# Patient Record
Sex: Female | Born: 1960 | Hispanic: No | Marital: Married
Health system: Southern US, Community
[De-identification: ages and names within clinical notes are randomized; demographics above are authoritative.]

## PROBLEM LIST (undated history)

## (undated) DIAGNOSIS — E079 Disorder of thyroid, unspecified: Secondary | ICD-10-CM

---

## 2010-03-06 ENCOUNTER — Ambulatory Visit (HOSPITAL_COMMUNITY)
Admission: RE | Admit: 2010-03-06 | Discharge: 2010-03-06 | Payer: Self-pay | Source: Home / Self Care | Admitting: Obstetrics and Gynecology

## 2010-07-03 LAB — CBC
Hemoglobin: 11.2 g/dL — ABNORMAL LOW (ref 12.0–15.0)
MCHC: 32.3 g/dL (ref 30.0–36.0)
Platelets: 409 10*3/uL — ABNORMAL HIGH (ref 150–400)

## 2010-07-03 LAB — BASIC METABOLIC PANEL
Calcium: 8.7 mg/dL (ref 8.4–10.5)
Creatinine, Ser: 0.99 mg/dL (ref 0.4–1.2)
GFR calc Af Amer: >60 mL/min (ref >60)
GFR calc non Af Amer: >60 mL/min (ref >60)
Glucose, Bld: 89 mg/dL (ref 70–99)
Sodium: 135 mEq/L (ref 135–145)

## 2010-07-03 LAB — HCG, SERUM, QUALITATIVE: Preg, Serum: NEGATIVE

## 2014-12-15 ENCOUNTER — Encounter: Payer: Self-pay | Admitting: Endocrinology

## 2017-08-19 ENCOUNTER — Emergency Department (HOSPITAL_COMMUNITY): Payer: BLUE CROSS/BLUE SHIELD

## 2017-08-19 ENCOUNTER — Encounter (HOSPITAL_COMMUNITY): Payer: Self-pay

## 2017-08-19 ENCOUNTER — Emergency Department (HOSPITAL_COMMUNITY)
Admission: EM | Admit: 2017-08-19 | Discharge: 2017-08-19 | Disposition: A | Discharge status: Home / Self Care | Payer: BLUE CROSS/BLUE SHIELD | Attending: Emergency Medicine | Admitting: Emergency Medicine

## 2017-08-19 ENCOUNTER — Other Ambulatory Visit: Payer: Self-pay

## 2017-08-19 DIAGNOSIS — R0602 Shortness of breath: Secondary | ICD-10-CM | POA: Diagnosis not present

## 2017-08-19 DIAGNOSIS — R110 Nausea: Secondary | ICD-10-CM | POA: Diagnosis not present

## 2017-08-19 DIAGNOSIS — R002 Palpitations: Secondary | ICD-10-CM | POA: Diagnosis not present

## 2017-08-19 DIAGNOSIS — R42 Dizziness and giddiness: Secondary | ICD-10-CM | POA: Diagnosis not present

## 2017-08-19 DIAGNOSIS — E876 Hypokalemia: Secondary | ICD-10-CM

## 2017-08-19 DIAGNOSIS — R5383 Other fatigue: Secondary | ICD-10-CM | POA: Diagnosis not present

## 2017-08-19 HISTORY — DX: Disorder of thyroid, unspecified: E07.9

## 2017-08-19 LAB — I-STAT TROPONIN, ED
TROPONIN I, POC: 0.01 ng/mL (ref 0.00–0.08)
Troponin i, poc: 0 ng/mL (ref 0.00–0.08)

## 2017-08-19 LAB — BASIC METABOLIC PANEL
ANION GAP: 15 (ref 5–15)
BUN: 12 mg/dL (ref 6–20)
CHLORIDE: 93 mmol/L — AB (ref 101–111)
CO2: 27 mmol/L (ref 22–32)
Calcium: 9.8 mg/dL (ref 8.9–10.3)
Creatinine, Ser: 1.05 mg/dL — ABNORMAL HIGH (ref 0.44–1.00)
GFR calc non Af Amer: 59 mL/min — ABNORMAL LOW (ref >60)
GLUCOSE: 128 mg/dL — AB (ref 65–99)
Potassium: 3.1 mmol/L — ABNORMAL LOW (ref 3.5–5.1)
Sodium: 135 mmol/L (ref 135–145)

## 2017-08-19 LAB — CBC
HEMATOCRIT: 48.2 % — AB (ref 36.0–46.0)
Hemoglobin: 16.6 g/dL — ABNORMAL HIGH (ref 12.0–15.0)
MCH: 31.5 pg (ref 26.0–34.0)
MCHC: 34.4 g/dL (ref 30.0–36.0)
MCV: 91.5 fL (ref 78.0–100.0)
Platelets: 353 10*3/uL (ref 150–400)
RBC: 5.27 MIL/uL — AB (ref 3.87–5.11)
RDW: 13 % (ref 11.5–15.5)
WBC: 9.5 10*3/uL (ref 4.0–10.5)

## 2017-08-19 LAB — T4, FREE: Free T4: 1.87 ng/dL — ABNORMAL HIGH (ref 0.82–1.77)

## 2017-08-19 LAB — D-DIMER, QUANTITATIVE (NOT AT ARMC)

## 2017-08-19 LAB — TSH: TSH: 0.785 u[IU]/mL (ref 0.350–4.500)

## 2017-08-19 MED ORDER — ONDANSETRON HCL 4 MG/2ML IJ SOLN
4.0000 mg | Freq: Once | INTRAMUSCULAR | Status: AC
  Administered 2017-08-19: 4 mg via INTRAVENOUS
  Filled 2017-08-19: qty 2

## 2017-08-19 MED ORDER — ONDANSETRON 4 MG PO TBDP: 4.0000 mg | ORAL_TABLET | Freq: Three times a day (TID) | ORAL | 0 refills | Status: AC | PRN

## 2017-08-19 MED ORDER — SODIUM CHLORIDE 0.9 % IV BOLUS
1000.0000 mL | Freq: Once | INTRAVENOUS | Status: AC
  Administered 2017-08-19: 1000 mL via INTRAVENOUS

## 2017-08-19 MED ORDER — POTASSIUM CHLORIDE CRYS ER 20 MEQ PO TBCR
60.0000 meq | EXTENDED_RELEASE_TABLET | Freq: Once | ORAL | Status: AC
  Administered 2017-08-19: 60 meq via ORAL
  Filled 2017-08-19: qty 3

## 2017-08-19 NOTE — ED Provider Notes (Signed)
Patient placed in Quick Look pathway, seen and evaluated   Chief Complaint: "not feeling well"  HPI:   Pt is a 57 y.o. female with a PMHx of hypothyroidism and HTN, presenting today with c/o not feeling well recently, having palpitations, occasional lightheadedness, fatigue, occasional nausea, and shortness of breath that she describes as feeling like she "cannot get a good breath", like it's "unsatisfying", but denies any pain with breathing.  Denies any chest pain.  She was seen by her PCP today and had an abnormal EKG so they sent her here for "enzymes" to make sure she wasn't having a heart attack.  ROS: +SOB, +palpitations, +lightheadedness, +nausea, +fatigue. No CP.    Physical Exam:  BP (!) 131/99 (BP Location: Right Arm)   Pulse (!) 108   Temp 98.1 F (36.7 C) (Oral)   Resp 18   SpO2 98%    Gen: No distress  Neuro: Awake and Alert  Skin: Warm    Focused Exam: Cardio: mildly tachycardic in the low 100s, reg rhythm, nl s1/s2, no m/r/g, distal pulses intact, no pedal edema. Pulm: CTAB in all lung fields, no w/r/r, no hypoxia or increased WOB, speaking in full sentences, SpO2 98% on RA    Initiation of care has begun. The patient has been counseled on the process, plan, and necessity for staying for the completion/evaluation, and the remainder of the medical screening examination     [address], [place], [place] 08/19/17 1430    Melene Plan, DO 08/19/17 1649

## 2017-08-19 NOTE — ED Triage Notes (Signed)
Pt presents for evaluation of sob and abnormal ekg. Pt went to pcp due to not feeling well (URI symptoms) x past week. Pt states its difficulty to get a good breath.

## 2017-08-19 NOTE — Discharge Instructions (Addendum)
Your labs, xray, and EKG are all reassuring, except for a low potassium level but you were given potassium here today; see the list of foods below that have potassium in them to increase the intake in your diet of this. Your symptoms could be from a variety of things including dehydration, muscle pain, indigestion, and other nonemergent issues. Use over the counter zantac/tums/maalox/pepto bismol as needed for symptoms. Avoid spicy/fatty/fried/acidic foods, avoid coffee/tea/soda/alcohol. Use zofran as directed as needed for nausea. Stay well hydrated and get plenty of rest. Use tylenol as needed for pain, use ibuprofen/NSAIDs sparingly and never on an empty stomach. You may consider using heat to the areas of chest pain, no more than 20 minutes every hour. Avoid caffeine as this can make palpitations and blood pressure worse. Eat a low salt/low sodium diet to help with blood pressure control. Keep a log of blood pressure readings morning and night to take to your next doctor's visit so they can continue to manage this issue. Follow up with your regular doctor in 3-5 days for recheck of symptoms. Return to the ER for changes or worsening symptoms.  SEEK IMMEDIATE MEDICAL ATTENTION IF: You develop a fever.  Your chest pains become severe or intolerable.  You develop new, unexplained symptoms (problems).  You develop worsening shortness of breath, nausea, vomiting, sweating or feel light headed.  You develop a new cough or you cough up blood. You develop new leg swelling

## 2017-08-19 NOTE — ED Provider Notes (Signed)
[facility] EMERGENCY DEPARTMENT Provider Note   CSN: [id number] Arrival date & time: 08/19/17  1344     History   Chief Complaint Chief Complaint  Patient presents with  . Shortness of Breath  . Abnormal ECG    HPI Caitlyn Avila is a 57 y.o. female with a PMHx of HTN, GERD, hiatal hernia, and hypothyroidism, who presents to the ED with complaints of "not feeling well" for about 5 days.  Patient states that she has had intermittent nausea, palpitations, feeling jittery, intermittent lightheadedness with standing, fatigue, and shortness of breath that she describes as feeling like she "cannot get a good breath" and as though it is "unsatisfying" when she breathes but denies painful breathing.  She also states that she gets "twinges" of occasional intermittent nonradiating left-sided chest pain that feels like a sharp tightness lasting for a few seconds before self resolving, with no known aggravating or alleviating factors; states she does not have this currently, but it happens occasionally, at random.  She has not tried anything for symptoms, no known aggravating factors.  She states that she used to be on triamterene-HCTZ 37.5-25 mg for her blood pressure but her blood pressure was doing well so she came off of it about 6 months ago and had done well until Friday when she noticed her blood pressure was slightly elevated so she restarted it thinking that this could have been why she was having these symptoms.  Otherwise she has not started any new medications recently.  Chart review reveals that she was seen by her PCP today and her EKG showed Q waves in V leads, so she was sent here for cardiac enzymes and further work up.  She is a non-smoker with no known family history of cardiac disease.  She mentions that her maternal grandfather and her maternal uncle both had PEs but otherwise no other family members with this, and no personal history of DVT/PE.  She denies any fevers,  chills, URI symptoms, cough, ongoing chest pain, diaphoresis, LE swelling, recent travel/surgery/immobilization, estrogen use, abd pain, V/D/C, hematuria, dysuria, myalgias, arthralgias, claudication, orthopnea, numbness, tingling, focal weakness, or any other complaints at this time.   The history is provided by the patient and medical records. No language interpreter was used.  Shortness of Breath  Associated symptoms include chest pain (occasionally, none ongoing). Pertinent negatives include no fever, no rhinorrhea, no sore throat, no ear pain, no cough, no vomiting, no abdominal pain and no leg swelling.    Past Medical History:  Diagnosis Date  . Thyroid disease     There are no active problems to display for this patient.   History reviewed. No pertinent surgical history.   OB History   None      Home Medications    Prior to Admission medications   Not on File    Family History No family history on file.  Social History Social History   Tobacco Use  . Smoking status: Not on file  Substance Use Topics  . Alcohol use: Not on file  . Drug use: Not on file     Allergies   Patient has no known allergies.   Review of Systems Review of Systems  Constitutional: Positive for fatigue. Negative for chills, diaphoresis and fever.       +jittery feeling  HENT: Negative for ear discharge, ear pain, rhinorrhea and sore throat.   Respiratory: Positive for shortness of breath. Negative for cough.   Cardiovascular: Positive for  chest pain (occasionally, none ongoing) and palpitations. Negative for leg swelling.  Gastrointestinal: Positive for nausea (intermittent). Negative for abdominal pain, constipation, diarrhea and vomiting.  Genitourinary: Negative for dysuria and hematuria.  Musculoskeletal: Negative for arthralgias and myalgias.  Skin: Negative for color change.  Allergic/Immunologic: Negative for immunocompromised state.  Neurological: Positive for  light-headedness. Negative for weakness and numbness.  Psychiatric/Behavioral: Negative for confusion.   All other systems reviewed and are negative for acute change except as noted in the HPI.    Physical Exam Updated Vital Signs BP 138/90 (BP Location: Right Arm)   Pulse 83   Temp 98.1 F (36.7 C) (Oral)   Resp 16   SpO2 97%   Physical Exam  Constitutional: She is oriented to person, place, and time. Vital signs are normal. She appears well-developed and well-nourished.  Non-toxic appearance. No distress.  Afebrile, nontoxic, NAD  HENT:  Head: Normocephalic and atraumatic.  Mouth/Throat: Oropharynx is clear and moist. Mucous membranes are dry.  Lips slightly dry  Eyes: Conjunctivae and EOM are normal. Right eye exhibits no discharge. Left eye exhibits no discharge.  Neck: Normal range of motion. Neck supple.  Cardiovascular: Normal rate, regular rhythm, normal heart sounds and intact distal pulses. Exam reveals no gallop and no friction rub.  No murmur heard. Earlier she was tachycardic however on exam she has a RRR, nl s1/s2, no m/r/g, distal pulses intact, no pedal edema   Pulmonary/Chest: Effort normal and breath sounds normal. No respiratory distress. She has no decreased breath sounds. She has no wheezes. She has no rhonchi. She has no rales. She exhibits no tenderness, no crepitus, no deformity and no retraction.  CTAB in all lung fields, no w/r/r, no hypoxia or increased WOB, speaking in full sentences, SpO2 97% on RA Chest wall nonTTP without crepitus, deformities, or retractions   Abdominal: Soft. Normal appearance and bowel sounds are normal. She exhibits no distension. There is tenderness in the epigastric area. There is no rigidity, no rebound, no guarding, no CVA tenderness, no tenderness at McBurney's point and negative Murphy's sign.  Soft, obese but nondistended, +BS throughout, with mild epigastric TTP, no r/g/r, neg murphy's, neg mcburney's, no CVA TTP     Musculoskeletal: Normal range of motion.  MAE x4 Strength and sensation grossly intact in all extremities Distal pulses intact Gait steady No pedal edema, neg homan's bilaterally   Neurological: She is alert and oriented to person, place, and time. She has normal strength. No sensory deficit.  Skin: Skin is warm, dry and intact. No rash noted.  Psychiatric: She has a normal mood and affect.  Nursing note and vitals reviewed.    ED Treatments / Results  Labs (all labs ordered are listed, but only abnormal results are displayed) Labs Reviewed  BASIC METABOLIC PANEL - Abnormal; Notable for the following components:      Result Value   Potassium 3.1 (*)    Chloride 93 (*)    Glucose, Bld 128 (*)    Creatinine, Ser 1.05 (*)    GFR calc non Af Amer 59 (*)    All other components within normal limits  CBC - Abnormal; Notable for the following components:   RBC 5.27 (*)    Hemoglobin 16.6 (*)    HCT 48.2 (*)    All other components within normal limits  T4, FREE - Abnormal; Notable for the following components:   Free T4 1.87 (*)    All other components within normal limits  TSH  D-DIMER, QUANTITATIVE (NOT AT [facility])  I-STAT TROPONIN, ED  I-STAT TROPONIN, ED    EKG EKG Interpretation  Date/Time:  Tuesday August 19 2017 14:03:43 EDT Ventricular Rate:  107 PR Interval:  150 QRS Duration: 84 QT Interval:  330 QTC Calculation: 440 R Axis:   -51 Text Interpretation:  Sinus tachycardia Possible Left atrial enlargement Left anterior fascicular block Inferior infarct , age undetermined Anterior infarct , age undetermined Abnormal ECG No old tracing to compare Confirmed by Melene Plan [phone]) on 08/19/2017 5:45:21 PM   Radiology Dg Chest 2 View  Result Date: 08/19/2017 CLINICAL DATA:  Shortness of breath. EXAM: CHEST - 2 VIEW COMPARISON:  None. FINDINGS: The heart size and mediastinal contours are within normal limits. Both lungs are clear. No pneumothorax or pleural effusion is  noted. The visualized skeletal structures are unremarkable. IMPRESSION: No active cardiopulmonary disease. Electronically Signed   By: Lupita Raider, M.D.   On: 08/19/2017 14:45    Procedures Procedures (including critical care time)  Medications Ordered in ED Medications  potassium chloride SA (K-DUR,KLOR-CON) CR tablet 60 mEq (60 mEq Oral Given 08/19/17 1957)  sodium chloride 0.9 % bolus 1,000 mL (0 mLs Intravenous Stopped 08/19/17 1948)  ondansetron (ZOFRAN) injection 4 mg (4 mg Intravenous Given 08/19/17 1901)     Initial Impression / Assessment and Plan / ED Course  I have reviewed the triage vital signs and the nursing notes.  Pertinent labs & imaging results that were available during my care of the patient were reviewed by me and considered in my medical decision making (see chart for details).     57 y.o. female here with not feeling well x5 days, having intermittent palpitations, SOB, jitteriness, fatigue, lightheadedness, nausea, and SOB. Also states she gets occasionally "twinges" of chest pain. On exam, no reproducible chest wall tenderness, she was initially tachycardic in triage, but this has since resolved; no hypoxia or pedal edema; very mild epigastric TTP but nonperitoneal. Work up thus far reveals: trop neg; BMP with mildly low K 3.1 will orally replete, also with mildly elevated Cr 1.05; CBC with elevated Hgb/Hct which also lends itself to hemoconcentration/dehydration; CXR neg; EKG with some anterior and lateral Q waves but otherwise no acute ischemic findings and fairly unremarkable (sinus tachy at the time EKG was done). Given her occasional CP with SOB, and her tachycardia earlier, will get D-dimer; will also get repeat troponin since it's been 4hrs since the first; and get TSH/T4. Will give potassium and fluids and reassess shortly.   6:42 PM Nursing staff telling me that pt vomited; pt states she got very lightheaded and then vomited which happened years ago when she  had BP problems (BP currently 130s/90s). Part of this could be due to the fact that she's been fasting all day because of her earlier PCP appt; will give zofran and let her eat something to try to get some nutrition in while we wait on remainder of work up.   9:39 PM There were some delays in her labs due to hemolysis of the D-dimer, however labs have finally resulted and show: TSH WNL; T4 marginally elevated at 1.87 but this is just barely over the cut off for upper end of normal, doubt clinically relevant at this point given TSH WNL; Second trop 0.00 which is less than her first (which was 0.01); D-dimer negative, therefore doubt PE in this low risk patient. Symptoms could be multifactorial, including dehydration related, medication related (since she just restarted her BP  med), vs indigestion/GI related, etc. Pt feeling much better and tolerating PO well here. Will send home with zofran, advised avoidance of caffeine, staying hydrated, getting rest, and other OTC remedies and diet/lifestyle modifications for symptomatic relief. F/up with PCP closely in 3-5 days for recheck of symptoms. Strict return precautions advised. Discussed case with my attending Dr. Adela Lank who agrees with plan.  I explained the diagnosis and have given explicit precautions to return to the ER including for any other new or worsening symptoms. The patient understands and accepts the medical plan as it's been dictated and I have answered their questions. Discharge instructions concerning home care and prescriptions have been given. The patient is STABLE and is discharged to home in good condition.    Final Clinical Impressions(s) / ED Diagnoses   Final diagnoses:  SOB (shortness of breath)  Intermittent lightheadedness  Nausea  Palpitations  Fatigue, unspecified type  Hypokalemia    ED Discharge Orders        Ordered    ondansetron (ZOFRAN ODT) 4 MG disintegrating tablet  Every 8 hours PRN     08/19/17 [address], [place], [place] 08/19/17 2141    Melene Plan, DO 08/19/17 2144

## 2017-08-19 NOTE — ED Notes (Signed)
Dimer hemolyzed per lab,

## 2018-06-08 DIAGNOSIS — D509 Iron deficiency anemia, unspecified: Secondary | ICD-10-CM | POA: Insufficient documentation

## 2018-06-09 DIAGNOSIS — G43109 Migraine with aura, not intractable, without status migrainosus: Secondary | ICD-10-CM | POA: Insufficient documentation

## 2018-08-17 DIAGNOSIS — Z5320 Procedure and treatment not carried out because of patient's decision for unspecified reasons: Secondary | ICD-10-CM | POA: Insufficient documentation

## 2019-04-03 IMAGING — CR DG CHEST 2V
2 series · 2 of 2 positions shown · non-contrast
Comparison: None.

CLINICAL DATA: Shortness of breath.

EXAM:
CHEST - 2 VIEW

[chest ap]
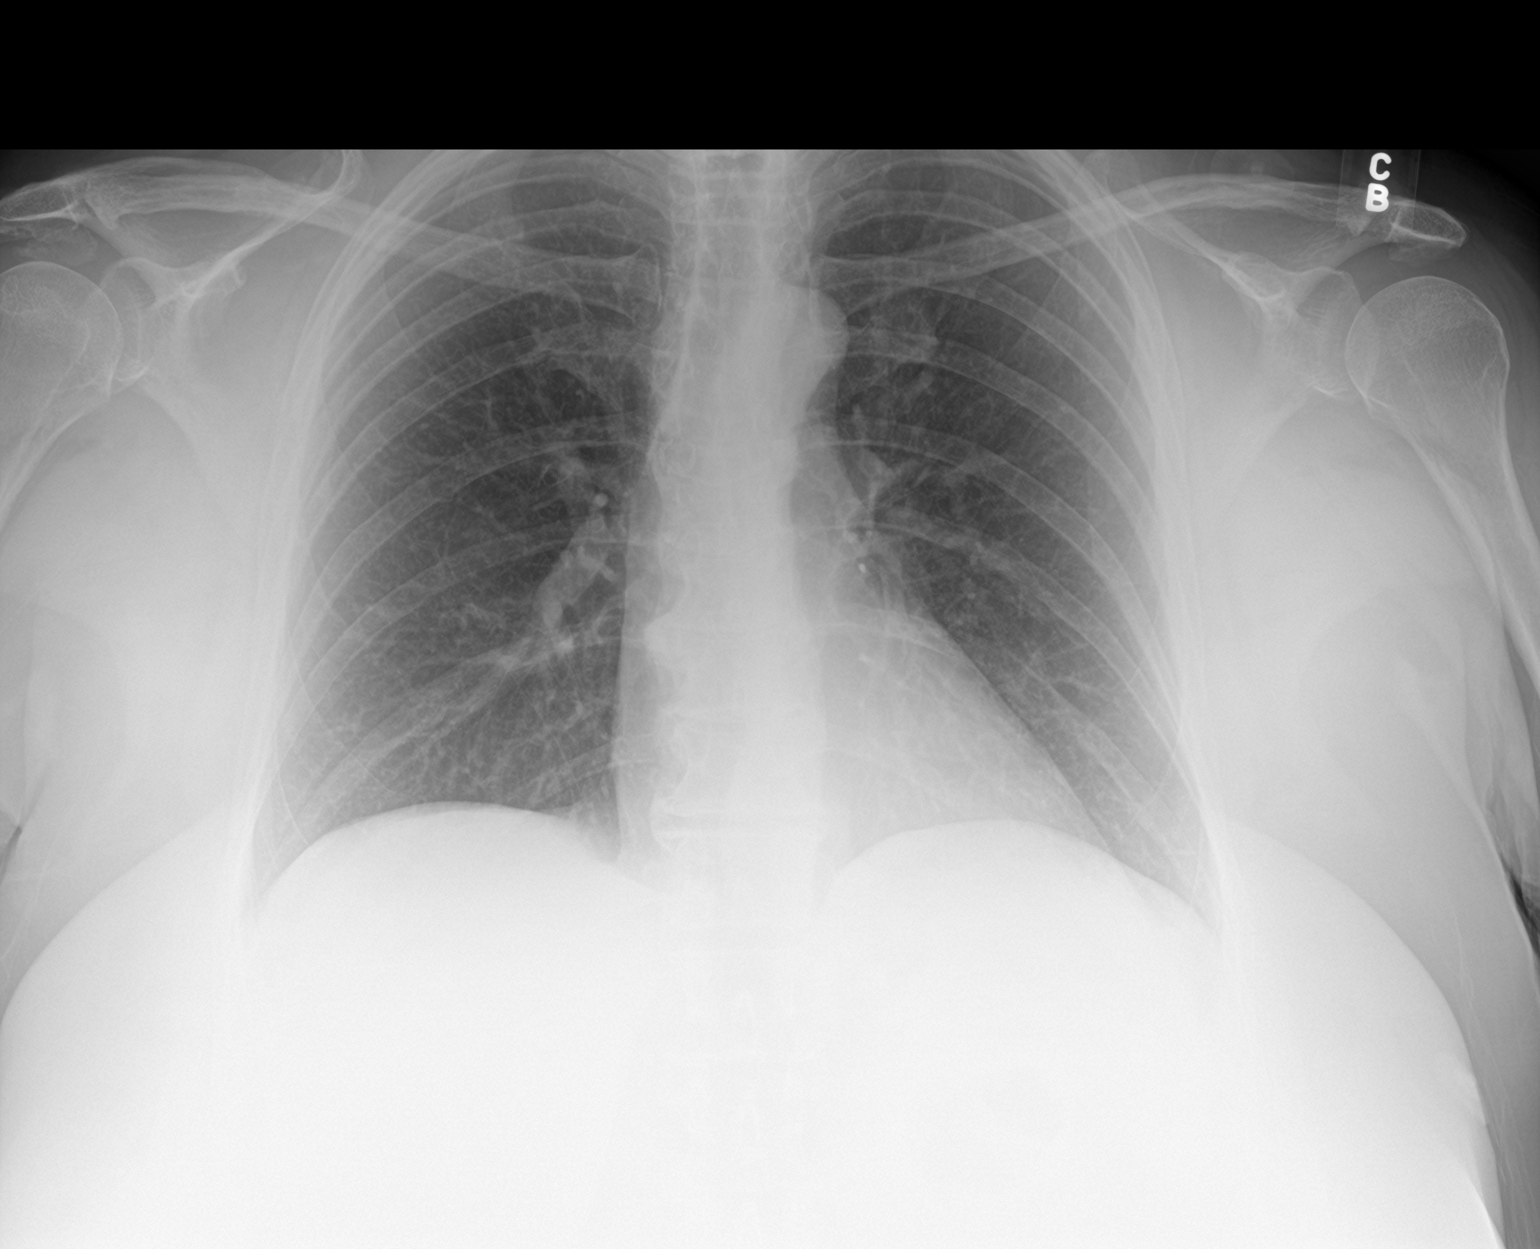

[chest lat]
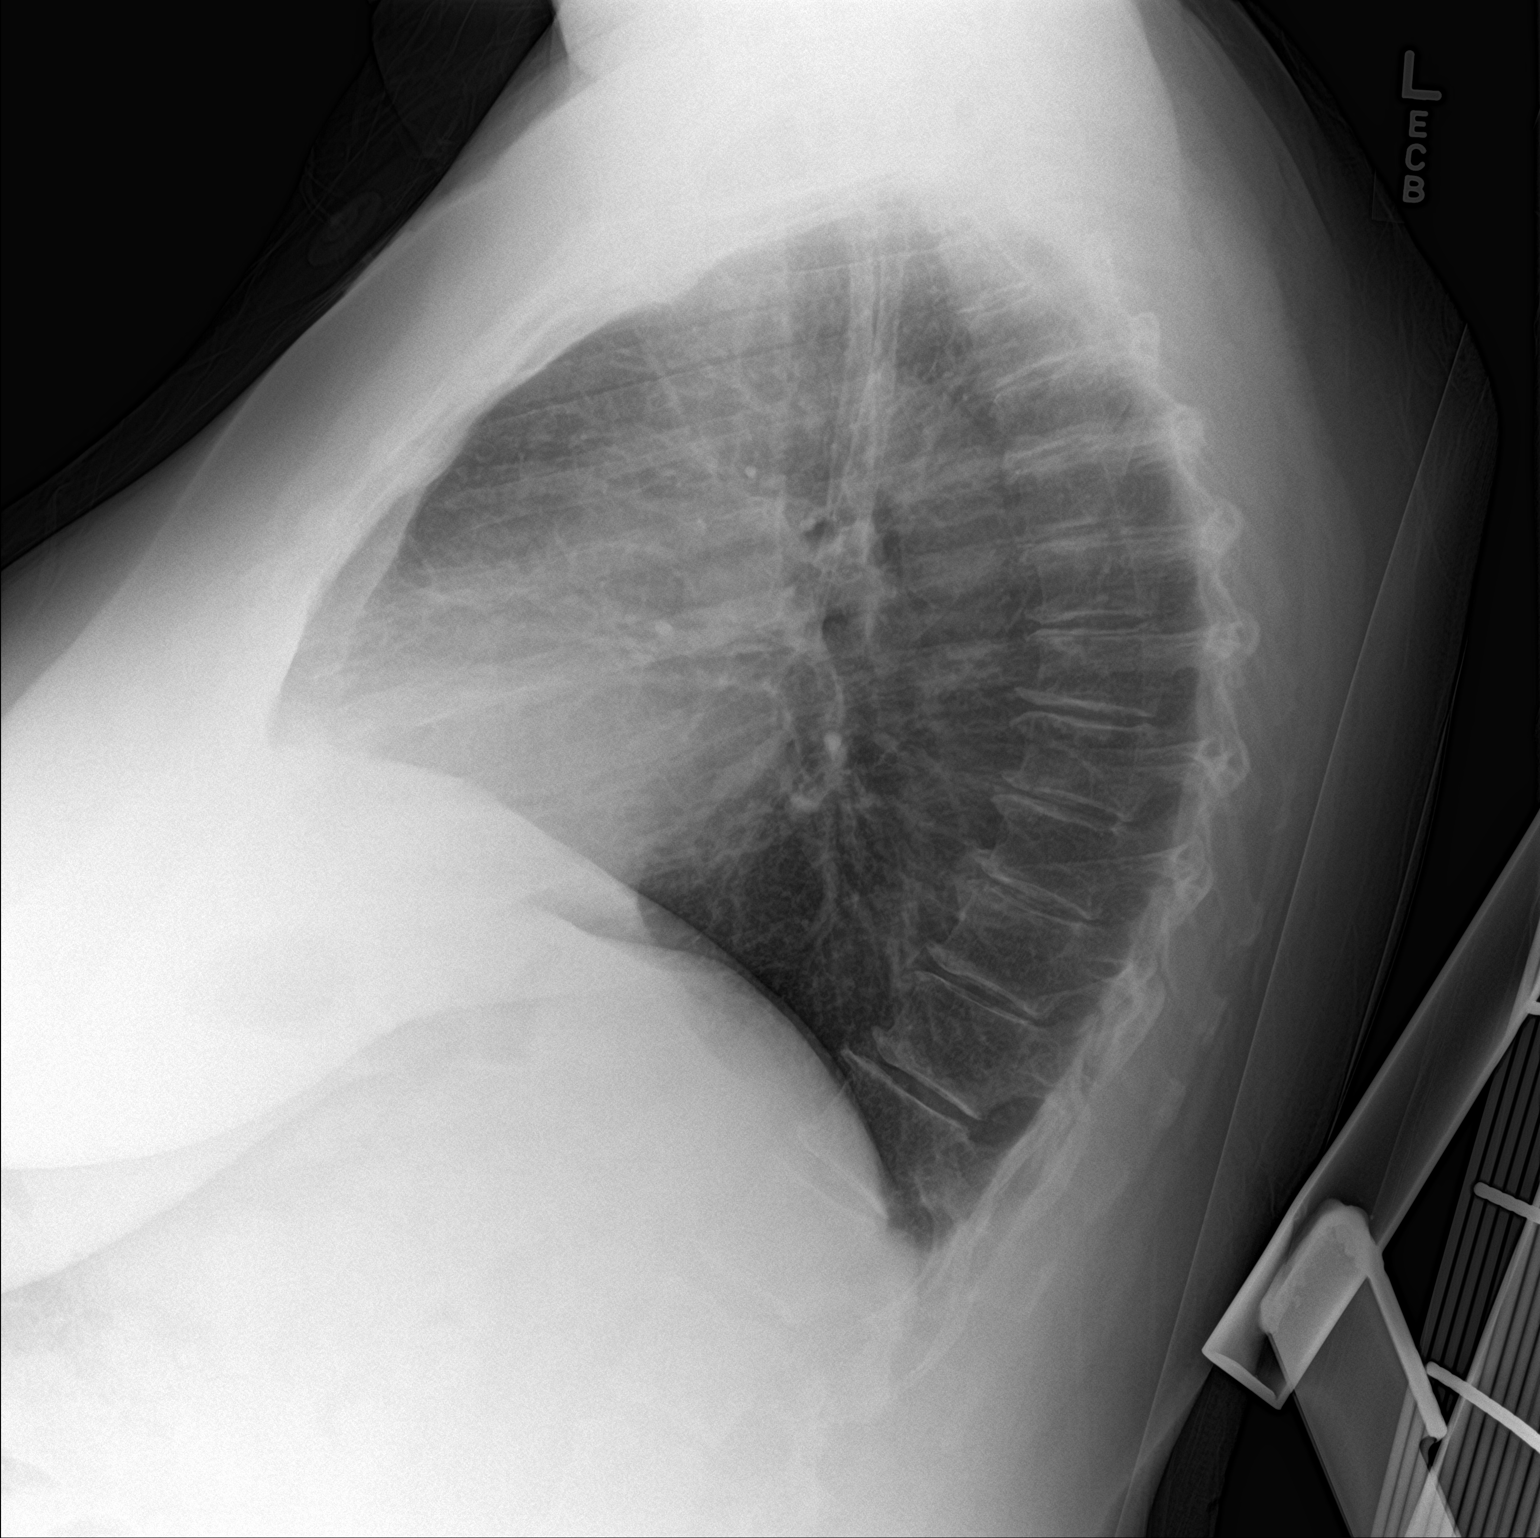

[2 of 2 positions shown; findings below may reference images not displayed]

FINDINGS: The heart size and mediastinal contours are within normal limits.
Both lungs are clear. No pneumothorax or pleural effusion is noted.
The visualized skeletal structures are unremarkable.
IMPRESSION: No active cardiopulmonary disease.

## 2021-10-17 ENCOUNTER — Other Ambulatory Visit: Payer: Self-pay

## 2021-10-17 ENCOUNTER — Encounter (HOSPITAL_BASED_OUTPATIENT_CLINIC_OR_DEPARTMENT_OTHER): Payer: Self-pay | Admitting: Emergency Medicine

## 2021-10-17 ENCOUNTER — Emergency Department (HOSPITAL_BASED_OUTPATIENT_CLINIC_OR_DEPARTMENT_OTHER)
Admission: EM | Admit: 2021-10-17 | Discharge: 2021-10-17 | Disposition: A | Discharge status: Home / Self Care | Payer: PRIVATE HEALTH INSURANCE | Attending: Emergency Medicine | Admitting: Emergency Medicine

## 2021-10-17 ENCOUNTER — Emergency Department (HOSPITAL_BASED_OUTPATIENT_CLINIC_OR_DEPARTMENT_OTHER): Payer: PRIVATE HEALTH INSURANCE

## 2021-10-17 DIAGNOSIS — Y9389 Activity, other specified: Secondary | ICD-10-CM | POA: Insufficient documentation

## 2021-10-17 DIAGNOSIS — M1712 Unilateral primary osteoarthritis, left knee: Secondary | ICD-10-CM | POA: Insufficient documentation

## 2021-10-17 DIAGNOSIS — M25462 Effusion, left knee: Secondary | ICD-10-CM | POA: Diagnosis not present

## 2021-10-17 DIAGNOSIS — M1710 Unilateral primary osteoarthritis, unspecified knee: Secondary | ICD-10-CM

## 2021-10-17 DIAGNOSIS — X509XXA Other and unspecified overexertion or strenuous movements or postures, initial encounter: Secondary | ICD-10-CM | POA: Diagnosis not present

## 2021-10-17 DIAGNOSIS — M25572 Pain in left ankle and joints of left foot: Secondary | ICD-10-CM | POA: Diagnosis present

## 2021-10-17 MED ORDER — TRAMADOL HCL 50 MG PO TABS: 50.0000 mg | ORAL_TABLET | Freq: Four times a day (QID) | ORAL | 0 refills | Status: AC | PRN

## 2021-10-17 NOTE — ED Triage Notes (Signed)
Pt c/o left knee pain since Saturday. States she was making her bed and felt her posterior left knee pull and since then she has difficulty with daily activities due to pain. Pt ambulatory with steady gait.

## 2021-10-17 NOTE — ED Provider Notes (Signed)
[facility] EMERGENCY DEPARTMENT Provider Note   CSN: [id number] Arrival date & time: 10/17/21  1529     History  Chief Complaint  Patient presents with   Knee Pain    Left    Caitlyn Avila is a 61 y.o. female history of arthritis, here presenting with left knee pain.  Patient states that several days ago, she tried to change the bedsheet and had her left knee on the bed.  She states that since then she noticed increasing pain and swelling to the knee.  Denies fevers.  She states that she been taking ibuprofen with minimal relief.  Patient tried to call [facility] where she had her wrist surgery previously and apparently they do not take her insurance now  The history is provided by the patient.       Home Medications Prior to Admission medications   Medication Sig Start Date End Date Taking? Authorizing Provider  b complex vitamins tablet Take 1 tablet by mouth daily.    [provider]  cholecalciferol (VITAMIN D) 1000 units tablet Take 1,000 Units by mouth daily.    [provider]  DULoxetine (CYMBALTA) 60 MG capsule Take 60 mg by mouth daily. 06/23/17   [provider]  levothyroxine (SYNTHROID, LEVOTHROID) 150 MCG tablet Take 150 mcg by mouth daily. 01/08/16   [provider]  omeprazole (PRILOSEC) 40 MG capsule Take 40 mg by mouth daily. 06/23/17   [provider]  ondansetron (ZOFRAN ODT) 4 MG disintegrating tablet Take 1 tablet (4 mg total) by mouth every 8 (eight) hours as needed for nausea or vomiting. 08/19/17   Street, [place], PA-C  triamterene-hydrochlorothiazide (DYAZIDE) 37.5-25 MG capsule Take 1 capsule by mouth daily. 06/24/16   [provider]      Allergies    Patient has no known allergies.    Review of Systems   Review of Systems  Musculoskeletal:        Left knee pain  All other systems reviewed and are negative.   Physical Exam Updated Vital Signs BP (!) 146/85 (BP Location: Left Arm)    Pulse 78   Temp 98.6 F (37 C) (Oral)   Resp 17   SpO2 98%  Physical Exam Vitals and nursing note reviewed.  HENT:     Head: Normocephalic.     Nose: Nose normal.  Eyes:     Pupils: Pupils are equal, round, and reactive to light.  Cardiovascular:     Pulses: Normal pulses.  Pulmonary:     Effort: Pulmonary effort is normal.  Musculoskeletal:     Cervical back: Normal range of motion.     Comments: Left knee with small effusion.  There is no obvious cellulitis.  Patient is able to range the knee.  PCL and ACL is stable.  No calf tenderness  Skin:    General: Skin is warm.     Capillary Refill: Capillary refill takes less than 2 seconds.  Neurological:     General: No focal deficit present.     Mental Status: She is alert and oriented to person, place, and time.  Psychiatric:        Mood and Affect: Mood normal.        Behavior: Behavior normal.     ED Results / Procedures / Treatments   Labs (all labs ordered are listed, but only abnormal results are displayed) Labs Reviewed - No data to display  EKG None  Radiology DG Knee Complete 4 Views  Left  Result Date: 10/17/2021 CLINICAL DATA:  Left knee pain EXAM: LEFT KNEE - COMPLETE 4+ VIEW COMPARISON:  None Available. FINDINGS: There is no evidence of acute fracture. There is tricompartment osteophyte formation with mild joint space narrowing. There is a moderate-sized joint effusion. IMPRESSION: Mild tricompartment osteoarthritis.  Moderate-sized joint effusion. No evidence of acute fracture. Electronically Signed   By: Caprice Renshaw M.D.   On: 10/17/2021 16:36    Procedures Procedures    Medications Ordered in ED Medications - No data to display  ED Course/ Medical Decision Making/ A&P                           Medical Decision Making Caitlyn Avila is a 61 y.o. female here presenting with left knee pain.  Patient likely has a knee sprain.  I reviewed her x-ray and independently interpreted.  Patient has arthritis  on the x-ray and has small knee effusion.  She has no signs of septic joint.  Patient is well-appearing.  Will refer to Ortho for arthrocentesis and possible joint injection.  Given knee sleeve in the ED.   Problems Addressed: Arthritis of knee: acute illness or injury Knee effusion, left: acute illness or injury  Amount and/or Complexity of Data Reviewed Radiology: ordered and independent interpretation performed. Decision-making details documented in ED Course.  Risk Prescription drug management.    Final Clinical Impression(s) / ED Diagnoses Final diagnoses:  None    Rx / DC Orders ED Discharge Orders     None         Charlynne Pander, MD 10/17/21 [phone]

## 2021-10-17 NOTE — Discharge Instructions (Addendum)
Please use knee sleeve to help with the swelling.  You have swelling inside your knee joint from arthritis.  You can take Tylenol or Motrin for pain and tramadol for severe pain  I referred you to Dr. Jordan Likes and also Dr. Steward Drone from ortho. Call their offices for appointment. You likely will need a knee injection and fluid removed from you knee   Return to ER if you have worse knee pain, fever

## 2021-10-18 ENCOUNTER — Ambulatory Visit (INDEPENDENT_AMBULATORY_CARE_PROVIDER_SITE_OTHER): Payer: PRIVATE HEALTH INSURANCE | Admitting: Family Medicine

## 2021-10-18 ENCOUNTER — Ambulatory Visit: Payer: Self-pay

## 2021-10-18 ENCOUNTER — Encounter: Payer: Self-pay | Admitting: Family Medicine

## 2021-10-18 VITALS — BP 120/78 | Ht 62.0 in | Wt 205.0 lb

## 2021-10-18 DIAGNOSIS — M179 Osteoarthritis of knee, unspecified: Secondary | ICD-10-CM | POA: Insufficient documentation

## 2021-10-18 DIAGNOSIS — M25462 Effusion, left knee: Secondary | ICD-10-CM

## 2021-10-18 DIAGNOSIS — M25562 Pain in left knee: Secondary | ICD-10-CM

## 2021-10-18 MED ORDER — METHYLPREDNISOLONE ACETATE 40 MG/ML IJ SUSP
40.0000 mg | Freq: Once | INTRAMUSCULAR | Status: AC
  Administered 2021-10-18: 40 mg via INTRA_ARTICULAR

## 2021-10-18 NOTE — Progress Notes (Signed)
  Caitlyn Avila - 61 y.o. female MRN [id number]  Date of birth: Nov 14, 1960  SUBJECTIVE:  Including CC & ROS.  No chief complaint on file.   Caitlyn Avila is a 61 y.o. female that is presenting with acute left knee pain.  The pain is occurring in the posterior aspect of the knee.  She is also having significant swelling.  No specific injury.  Having limited range of motion.  No history of surgery.  Review of the emergency department note from 6/28 shows she was counseled on supportive care Independent review of the left knee x-ray from 6/28 shows a joint effusion  Review of Systems See HPI   HISTORY: Past Medical, Surgical, Social, and Family History Reviewed & Updated per EMR.   Pertinent Historical Findings include:  Past Medical History:  Diagnosis Date   Thyroid disease     History reviewed. No pertinent surgical history.   PHYSICAL EXAM:  VS: BP 120/78 (BP Location: Left Arm, Patient Position: Sitting)   Ht 5\' 2"  (1.575 m)   Wt 205 lb (93 kg)   BMI 37.49 kg/m  Physical Exam Gen: NAD, alert, cooperative with exam, well-appearing MSK:  Neurovascularly intact    Limited ultrasound: Left knee:  Moderate effusion in the suprapatellar pouch. Normal-appearing quadricep and patellar tendon. Mild medial joint space narrowing. Outpouching of the medial meniscus with some hyperemia. Baker's cyst is appreciated  Summary: Degenerative meniscus laterally with effusion and Baker's cyst  Ultrasound and interpretation by , MD  Aspiration/Injection Procedure Note Caitlyn Avila 07/02/1960  Procedure: Aspiration and Injection Indications: Left knee pain  Procedure Details Consent: Risks of procedure as well as the alternatives and risks of each were explained to the (patient/caregiver).  Consent for procedure obtained. Time Out: Verified patient identification, verified procedure, site/side was marked, verified correct patient position, special  equipment/implants available, medications/allergies/relevent history reviewed, required imaging and test results available.  Performed.  The area was cleaned with iodine and alcohol swabs.    The left knee superior lateral suprapatellar pouch was injected using 3 cc of 1% lidocaine on a 25-gauge 1-1/2 inch needle.  An 18-gauge 1 and 1 officiated was used to achieve aspiration.  The syringe was switched to mixture containing 1 cc's of 40 mg Depo-Medrol and 4 cc's of 0.25% bupivacaine was injected.  Ultrasound was used. Images were obtained in long views showing the injection.    Amount of Fluid Aspirated:  81mL Character of Fluid: clear and straw colored Fluid was sent for: n/a  A sterile dressing was applied.  Patient did tolerate procedure well.     ASSESSMENT & PLAN:   Effusion of left knee Acutely occurring.  Effusion likely secondary to degenerative changes of the meniscus. -Counseled on home exercise therapy and supportive care. -Aspiration and injection. -Could consider physical therapy

## 2021-10-18 NOTE — Patient Instructions (Signed)
Nice to meet you Please use ice as needed. You can consider compression. Please try the exercises Please send me a message in MyChart with any questions or updates.  Please see me back in 4 weeks.   --Dr. Jordan Likes

## 2021-10-18 NOTE — Assessment & Plan Note (Signed)
Acutely occurring.  Effusion likely secondary to degenerative changes of the meniscus. -Counseled on home exercise therapy and supportive care. -Aspiration and injection. -Could consider physical therapy

## 2021-11-12 ENCOUNTER — Other Ambulatory Visit: Payer: Self-pay | Admitting: Family Medicine

## 2021-11-12 MED ORDER — MELOXICAM 15 MG PO TABS: 15.0000 mg | ORAL_TABLET | Freq: Every day | ORAL | 1 refills | Status: DC

## 2021-11-12 NOTE — Progress Notes (Signed)
Patient asked for medication to help with knee swelling - declined appointment for today.  Reviewed recent labs, sent in meloxicam for her.

## 2021-11-19 ENCOUNTER — Encounter: Payer: Self-pay | Admitting: Family Medicine

## 2021-11-19 ENCOUNTER — Ambulatory Visit (INDEPENDENT_AMBULATORY_CARE_PROVIDER_SITE_OTHER): Payer: PRIVATE HEALTH INSURANCE | Admitting: Family Medicine

## 2021-11-19 VITALS — BP 128/84 | Ht 62.0 in | Wt 205.0 lb

## 2021-11-19 DIAGNOSIS — M1712 Unilateral primary osteoarthritis, left knee: Secondary | ICD-10-CM | POA: Diagnosis not present

## 2021-11-19 MED ORDER — DICLOFENAC SODIUM 75 MG PO TBEC: 75.0000 mg | DELAYED_RELEASE_TABLET | Freq: Two times a day (BID) | ORAL | 3 refills | Status: AC

## 2021-11-19 NOTE — Progress Notes (Signed)
  Caitlyn Avila - 61 y.o. female MRN [id number]  Date of birth: 13-Jun-1960  SUBJECTIVE:  Including CC & ROS.  No chief complaint on file.   Caitlyn Avila is a 61 y.o. female that is following up for her left knee pain.  She did well with the injection but the pain has returned.  She tried meloxicam but it was giving her headaches.  The pain does radiate down the lateral anterior aspect of the tibial shaft.   Review of Systems See HPI   HISTORY: Past Medical, Surgical, Social, and Family History Reviewed & Updated per EMR.   Pertinent Historical Findings include:  Past Medical History:  Diagnosis Date   Thyroid disease     History reviewed. No pertinent surgical history.   PHYSICAL EXAM:  VS: BP 128/84 (BP Location: Left Arm, Patient Position: Sitting)   Ht 5\' 2"  (1.575 m)   Wt 205 lb (93 kg)   BMI 37.49 kg/m  Physical Exam Gen: NAD, alert, cooperative with exam, well-appearing MSK:  Neurovascularly intact       ASSESSMENT & PLAN:   OA (osteoarthritis) of knee Acutely occurring.  Did well with the injection for a few weeks but pain has returned as well as stiffness and swelling. -Counseled on home exercise therapy and supportive care. -Diclofenac.  She had headaches with meloxicam. -Pursue gel injection. -Could consider further imaging or physical therapy.

## 2021-11-19 NOTE — Assessment & Plan Note (Signed)
Acutely occurring.  Did well with the injection for a few weeks but pain has returned as well as stiffness and swelling. -Counseled on home exercise therapy and supportive care. -Diclofenac.  She had headaches with meloxicam. -Pursue gel injection. -Could consider further imaging or physical therapy.

## 2021-11-20 ENCOUNTER — Telehealth: Payer: Self-pay | Admitting: Family Medicine

## 2021-11-20 NOTE — Telephone Encounter (Signed)
Submitted request for gel injections to [facility] today.

## 2021-11-23 NOTE — Telephone Encounter (Signed)
Received benefit summary for Monovisc. Deductible does not apply. Monovisc and procedure are both covered at 100%. PA is required through [employer]. PA form and clinicals faxed to [phone].

## 2021-12-04 ENCOUNTER — Encounter: Payer: Self-pay | Admitting: Family Medicine

## 2021-12-04 ENCOUNTER — Ambulatory Visit: Payer: Self-pay

## 2021-12-04 ENCOUNTER — Ambulatory Visit (INDEPENDENT_AMBULATORY_CARE_PROVIDER_SITE_OTHER): Payer: PRIVATE HEALTH INSURANCE | Admitting: Family Medicine

## 2021-12-04 VITALS — BP 130/80 | Ht 62.0 in | Wt 205.0 lb

## 2021-12-04 DIAGNOSIS — M1712 Unilateral primary osteoarthritis, left knee: Secondary | ICD-10-CM

## 2021-12-04 MED ORDER — KETOROLAC TROMETHAMINE 30 MG/ML IJ SOLN
30.0000 mg | Freq: Once | INTRAMUSCULAR | Status: AC
  Administered 2021-12-04: 30 mg via INTRA_ARTICULAR

## 2021-12-04 NOTE — Patient Instructions (Signed)
Good to see you Please use ice  We will call with the gel injection  Please send me a message in MyChart with any questions or updates.  Please see me back in 4 weeks.   --Dr. Jordan Likes

## 2021-12-04 NOTE — Progress Notes (Signed)
  Caitlyn Avila - 61 y.o. female MRN [id number]  Date of birth: June 20, 1960  SUBJECTIVE:  Including CC & ROS.  No chief complaint on file.   Caitlyn Avila is a 61 y.o. female that is presenting with acute worsening of her left knee pain.  She is having limited flexion and extension.  Pain is occurring over the medial joint space.   Review of Systems See HPI   HISTORY: Past Medical, Surgical, Social, and Family History Reviewed & Updated per EMR.   Pertinent Historical Findings include:  Past Medical History:  Diagnosis Date   Thyroid disease     History reviewed. No pertinent surgical history.   PHYSICAL EXAM:  VS: BP 130/80 (BP Location: Left Arm, Patient Position: Sitting)   Ht 5\' 2"  (1.575 m)   Wt 205 lb (93 kg)   BMI 37.49 kg/m  Physical Exam Gen: NAD, alert, cooperative with exam, well-appearing MSK:  Neurovascularly intact     Aspiration/Injection Procedure Note Caitlyn Avila [birth year]  Procedure: Aspiration and Injection Indications: Left knee pain  Procedure Details Consent: Risks of procedure as well as the alternatives and risks of each were explained to the (patient/caregiver).  Consent for procedure obtained. Time Out: Verified patient identification, verified procedure, site/side was marked, verified correct patient position, special equipment/implants available, medications/allergies/relevent history reviewed, required imaging and test results available.  Performed.  The area was cleaned with iodine and alcohol swabs.    The left knee superior lateral suprapatellar pouch was injected using 3 cc of 1% lidocaine on a 22-gauge 1-1/2 inch needle.  An 18-gauge 1-1/2 inch needle was used to achieve aspiration.  The syringe was switched and a mixture containing 1 cc's of 30 mg Toradol and 4 cc's of 0.25% bupivacaine was injected.  Ultrasound was used. Images were obtained in long views showing the injection.    Amount of Fluid Aspirated:  39mL Character  of Fluid: clear and straw colored Fluid was sent for: n/a  A sterile dressing was applied.  Patient did tolerate procedure well.     ASSESSMENT & PLAN:   OA (osteoarthritis) of knee Acutely worsening.  Having ongoing effusion. -Counseled on home exercise therapy and supportive care. -Aspiration injection today. -Pursuing gel injection. -Could consider physical therapy or further imaging.

## 2021-12-04 NOTE — Assessment & Plan Note (Signed)
Acutely worsening.  Having ongoing effusion. -Counseled on home exercise therapy and supportive care. -Aspiration injection today. -Pursuing gel injection. -Could consider physical therapy or further imaging.

## 2021-12-07 NOTE — Telephone Encounter (Signed)
Received fax from [employer] stating Euflexxa is approved as the preferred HA gel injection for patient's left knee. Pt is aware of this and I am looking into ordering Euflexxa.

## 2021-12-14 NOTE — Telephone Encounter (Addendum)
Euflexxa shipped and receieved here at [facility]. Langley Adie Called patient to schedule. She was provided J code and CPT code for injection and procedure. Patient is checking with her insurance for benefits and OOP for Euflexxa injection. She will call us back to schedule.

## 2021-12-16 DIAGNOSIS — F418 Other specified anxiety disorders: Secondary | ICD-10-CM | POA: Insufficient documentation

## 2021-12-20 ENCOUNTER — Ambulatory Visit (INDEPENDENT_AMBULATORY_CARE_PROVIDER_SITE_OTHER): Payer: PRIVATE HEALTH INSURANCE | Admitting: Family Medicine

## 2021-12-20 ENCOUNTER — Ambulatory Visit: Payer: Self-pay

## 2021-12-20 ENCOUNTER — Encounter: Payer: Self-pay | Admitting: Family Medicine

## 2021-12-20 VITALS — BP 146/85 | Ht 62.0 in | Wt 205.0 lb

## 2021-12-20 DIAGNOSIS — M1712 Unilateral primary osteoarthritis, left knee: Secondary | ICD-10-CM

## 2021-12-20 NOTE — Assessment & Plan Note (Signed)
Completed Euflexxa injection 1/3.

## 2021-12-20 NOTE — Progress Notes (Signed)
  Caitlyn Avila - 61 y.o. female MRN [id number]  Date of birth: 1960-07-08  SUBJECTIVE:  Including CC & ROS.  No chief complaint on file.   Caitlyn Avila is a 61 y.o. female that is  here for gel injection.    Review of Systems See HPI   HISTORY: Past Medical, Surgical, Social, and Family History Reviewed & Updated per EMR.   Pertinent Historical Findings include:  Past Medical History:  Diagnosis Date   Thyroid disease     History reviewed. No pertinent surgical history.   PHYSICAL EXAM:  VS: BP (!) 146/85 (BP Location: Right Arm, Patient Position: Sitting)   Ht 5\' 2"  (1.575 m)   Wt 205 lb (93 kg)   BMI 37.49 kg/m  Physical Exam Gen: NAD, alert, cooperative with exam, well-appearing MSK:  Neurovascularly intact     Aspiration/Injection Procedure Note Caitlyn Avila 1961-02-26  Procedure: Injection Indications: left knee pain  Procedure Details Consent: Risks of procedure as well as the alternatives and risks of each were explained to the (patient/caregiver).  Consent for procedure obtained. Time Out: Verified patient identification, verified procedure, site/side was marked, verified correct patient position, special equipment/implants available, medications/allergies/relevent history reviewed, required imaging and test results available.  Performed.  The area was cleaned with iodine and alcohol swabs.    The left knee superior lateral suprapatellar pouch was injected using 4 cc's of 1% lidocaine with a 21 2" needle.  The syringe was switched and  2 mL of Euflexxa was injected. Ultrasound was used. Images were obtained in  Long views showing the injection.    A sterile dressing was applied.  Patient did tolerate procedure well.    ASSESSMENT & PLAN:   OA (osteoarthritis) of knee Completed Euflexxa injection 1/3.

## 2021-12-27 MED ORDER — SODIUM HYALURONATE (VISCOSUP) 20 MG/2ML IX SOSY
1.0000 | PREFILLED_SYRINGE | Freq: Once | INTRA_ARTICULAR | Status: AC
  Administered 2021-12-20: 20 mg via INTRA_ARTICULAR

## 2021-12-27 NOTE — Addendum Note (Signed)
Addended by: Merrilyn Puma on: 12/27/2021 09:14 AM   Modules accepted: Orders

## 2021-12-28 ENCOUNTER — Encounter: Payer: Self-pay | Admitting: Family Medicine

## 2021-12-28 ENCOUNTER — Ambulatory Visit: Payer: Self-pay

## 2021-12-28 ENCOUNTER — Ambulatory Visit (INDEPENDENT_AMBULATORY_CARE_PROVIDER_SITE_OTHER): Payer: PRIVATE HEALTH INSURANCE | Admitting: Family Medicine

## 2021-12-28 VITALS — BP 120/70 | Ht 62.0 in | Wt 205.0 lb

## 2021-12-28 DIAGNOSIS — M1712 Unilateral primary osteoarthritis, left knee: Secondary | ICD-10-CM | POA: Diagnosis not present

## 2021-12-28 MED ORDER — SODIUM HYALURONATE (VISCOSUP) 20 MG/2ML IX SOSY
1.0000 | PREFILLED_SYRINGE | Freq: Once | INTRA_ARTICULAR | Status: AC
  Administered 2021-12-28: 20 mg via INTRA_ARTICULAR

## 2021-12-28 NOTE — Patient Instructions (Signed)
Good to see you Please use ice as needed  Please send me a message in MyChart with any questions or updates.  Please see me back in 1 week.   --Dr. Mary-Anne Polizzi  

## 2021-12-28 NOTE — Addendum Note (Signed)
Addended by: Merrilyn Puma on: 12/28/2021 09:48 AM   Modules accepted: Orders

## 2021-12-28 NOTE — Assessment & Plan Note (Signed)
Completed gel injection 2/3  

## 2021-12-28 NOTE — Progress Notes (Signed)
  Caitlyn Avila - 61 y.o. female MRN [id number]  Date of birth: 1960-07-18  SUBJECTIVE:  Including CC & ROS.  No chief complaint on file.   Caitlyn Avila is a 62 y.o. female that is  here for gel injection .    Review of Systems See HPI   HISTORY: Past Medical, Surgical, Social, and Family History Reviewed & Updated per EMR.   Pertinent Historical Findings include:  Past Medical History:  Diagnosis Date   Thyroid disease     History reviewed. No pertinent surgical history.   PHYSICAL EXAM:  VS: BP 120/70 (BP Location: Left Arm, Patient Position: Sitting)   Ht 5\' 2"  (1.575 m)   Wt 205 lb (93 kg)   BMI 37.49 kg/m  Physical Exam Gen: NAD, alert, cooperative with exam, well-appearing MSK:  Neurovascularly intact    Aspiration/Injection Procedure Note Caitlyn Avila 1960-06-23   Procedure: Injection Indications: left knee pain   Procedure Details Consent: Risks of procedure as well as the alternatives and risks of each were explained to the (patient/caregiver).  Consent for procedure obtained. Time Out: Verified patient identification, verified procedure, site/side was marked, verified correct patient position, special equipment/implants available, medications/allergies/relevent history reviewed, required imaging and test results available.  Performed.  The area was cleaned with iodine and alcohol swabs.     The left knee superior lateral suprapatellar pouch was injected using 4 cc's of 1% lidocaine with a 21 2" needle.  The syringe was switched and  2 mL of Euflexxa was injected. Ultrasound was used. Images were obtained in  Long views showing the injection.     A sterile dressing was applied.   Patient did tolerate procedure well.   ASSESSMENT & PLAN:   OA (osteoarthritis) of knee Completed gel injection. 2/3

## 2022-01-03 ENCOUNTER — Encounter: Payer: Self-pay | Admitting: Family Medicine

## 2022-01-03 ENCOUNTER — Ambulatory Visit: Payer: Self-pay

## 2022-01-03 ENCOUNTER — Ambulatory Visit (INDEPENDENT_AMBULATORY_CARE_PROVIDER_SITE_OTHER): Payer: PRIVATE HEALTH INSURANCE | Admitting: Family Medicine

## 2022-01-03 VITALS — BP 132/90 | Ht 62.0 in | Wt 205.0 lb

## 2022-01-03 DIAGNOSIS — M1712 Unilateral primary osteoarthritis, left knee: Secondary | ICD-10-CM | POA: Diagnosis not present

## 2022-01-03 MED ORDER — SODIUM HYALURONATE (VISCOSUP) 20 MG/2ML IX SOSY: PREFILLED_SYRINGE | Freq: Once | INTRA_ARTICULAR | Status: AC

## 2022-01-03 NOTE — Patient Instructions (Signed)
Good to see you Please use ice   Please send me a message in MyChart with any questions or updates.  Please see me back in 4-6 weeks or as needed if better.   --Dr. Jordan Likes

## 2022-01-03 NOTE — Assessment & Plan Note (Signed)
Completed gel injection 3/3  - could consider further imaging or zilretta

## 2022-01-03 NOTE — Progress Notes (Signed)
  Caitlyn Avila - 61 y.o. female MRN [id number]  Date of birth: 12-12-1960  SUBJECTIVE:  Including CC & ROS.  No chief complaint on file.   Caitlyn Avila is a 61 y.o. female that is  here for a gel injection.    Review of Systems See HPI   HISTORY: Past Medical, Surgical, Social, and Family History Reviewed & Updated per EMR.   Pertinent Historical Findings include:  Past Medical History:  Diagnosis Date   Thyroid disease     History reviewed. No pertinent surgical history.   PHYSICAL EXAM:  VS: BP (!) 132/90 (BP Location: Left Arm, Patient Position: Sitting)   Ht 5\' 2"  (1.575 m)   Wt 205 lb (93 kg)   BMI 37.49 kg/m  Physical Exam Gen: NAD, alert, cooperative with exam, well-appearing MSK:  Neurovascularly intact    Aspiration/Injection Procedure Note Caitlyn Avila 11-04-1960   Procedure: Injection Indications: left knee pain   Procedure Details Consent: Risks of procedure as well as the alternatives and risks of each were explained to the (patient/caregiver).  Consent for procedure obtained. Time Out: Verified patient identification, verified procedure, site/side was marked, verified correct patient position, special equipment/implants available, medications/allergies/relevent history reviewed, required imaging and test results available.  Performed.  The area was cleaned with iodine and alcohol swabs.     The left knee superior lateral suprapatellar pouch was injected using 4 cc's of 1% lidocaine with a 21 2" needle.  The syringe was switched and  2 mL of Euflexxa was injected. Ultrasound was used. Images were obtained in  Long views showing the injection.     A sterile dressing was applied.   Patient did tolerate procedure well.   ASSESSMENT & PLAN:   OA (osteoarthritis) of knee Completed gel injection 3/3  - could consider further imaging or zilretta

## 2022-01-03 NOTE — Addendum Note (Signed)
Addended by: Merrilyn Puma on: 01/03/2022 01:37 PM   Modules accepted: Orders

## 2022-02-28 ENCOUNTER — Encounter (INDEPENDENT_AMBULATORY_CARE_PROVIDER_SITE_OTHER): Payer: Self-pay | Admitting: Nurse Practitioner

## 2022-02-28 ENCOUNTER — Ambulatory Visit (INDEPENDENT_AMBULATORY_CARE_PROVIDER_SITE_OTHER): Payer: No Typology Code available for payment source | Admitting: Nurse Practitioner

## 2022-02-28 VITALS — BP 114/77 | HR 90 | Temp 98.2°F | Wt 151.0 lb

## 2022-02-28 DIAGNOSIS — R519 Headache, unspecified: Secondary | ICD-10-CM

## 2022-02-28 MED ORDER — ACETAMINOPHEN 500 MG OR TABS
1000.0000 mg | ORAL_TABLET | Freq: Once | ORAL | Status: DC
Start: 2022-02-28 — End: 2022-02-28

## 2022-02-28 MED ORDER — KETOROLAC TROMETHAMINE 60 MG/2ML IM SOLN
60.0000 mg | Freq: Once | INTRAMUSCULAR | Status: AC
Start: 2022-02-28 — End: 2022-02-28
  Administered 2022-02-28: 60 mg via INTRAMUSCULAR

## 2022-02-28 MED ORDER — ONDANSETRON 4 MG OR TBDP
8.0000 mg | ORAL_TABLET | Freq: Once | ORAL | Status: AC
Start: 2022-02-28 — End: 2022-02-28
  Administered 2022-02-28: 8 mg via ORAL

## 2022-02-28 NOTE — Patient Instructions (Signed)
Your medical care was provided today by: Georgeanne Frankland Jean-Baptiste, [place], ARNP-BC  Thank You for the opportunity to serve you.    Special Instructions:  Unless otherwise instructed, please see your Primary Care Provider (PCP)  in 3-4 days if your symptoms persist, or sooner if you worsen, or change dramatically.      If you are unable to see or do not have a PCP,  go to the nearest Hospital Emergency Department (ER) or return to the Urgent Care Center.      Always call 9-1-1 immediately if you develop a life threatening emergency.    Unless told otherwise please take all medications as directed and complete prescription therapies as directed.      If prescribed do not to take prescribed medication(s) for pain while operating a vehicle or heavy machinery due to potential side effects such as drowsiness and/or dizziness.     Other special instructions are listed below unless otherwise specified:

## 2022-02-28 NOTE — Progress Notes (Signed)
Toradol 60 mg  given today without initial adverse effect. YES    Quita Skye CMA

## 2022-02-28 NOTE — Progress Notes (Signed)
Chief Complaint   Patient presents with    Headache     Migraine H.A. x am        Subjective:       Kelly Gamble is a 61 year old female who presents on 02/28/2022.    Migraine Headache x  today. No nausea, vomiting. Rates currently 6-7/10. Constant frontal aching pain.   Maxalt x 3 today without relief.   ROS:   Constitutional: crying.    Eyes: Negative    Ears, Nose, Mouth, Throat: Negative    Cardiovascular: Negative    Respiratory: Negative    Gastrointestinal: Negative   Genitourinary: Negative   Musculoskeletal: Negative    Skin: Negative    Neurological: As noted in HPI above   Psychiatric: Negative    Endocrine: Negative    Hematologic/Lymphatic: Negative   Allergic/Immunologic: Negative         Patient Active Problem List   Diagnosis    Migraine with aura and without status migrainosus, not intractable             There are no diagnoses linked to this encounter.     Objective:    Vitals: BP 114/77   Pulse 90   Temp 36.8 C (Temporal)   Wt 68.5 kg (151 lb)   SpO2 99% Comment: Rm air    Physical Exam  Vitals and nursing note reviewed.   Constitutional:       Comments: crying   HENT:      Right Ear: Tympanic membrane normal.      Left Ear: Tympanic membrane normal.      Nose: Rhinorrhea present.      Mouth/Throat:      Mouth: Mucous membranes are moist.   Eyes:      Pupils: Pupils are equal, round, and reactive to light.   Cardiovascular:      Rate and Rhythm: Normal rate and regular rhythm.   Pulmonary:      Effort: Pulmonary effort is normal.      Breath sounds: Normal breath sounds.   Musculoskeletal:      Cervical back: No tenderness.   Lymphadenopathy:      Cervical: No cervical adenopathy.   Neurological:      Mental Status: She is alert.      Cranial Nerves: Cranial nerves 2-12 are intact.      Motor: Motor function is intact.      Coordination: Coordination is intact.      Gait: Gait is intact.     Assessment and Plan:          Letha was seen today for headache.    Acute intractable headache,  unspecified headache type  -     ketorolac (Toradol) injection 60 mg  -     ondansetron (Zofran ODT) disintegrating tablet 8 mg       Medication given in Urgent Care: Toradol and zofran  Rest; avoid excess fatigue  Eat regular meals  Avoid aggravating activities   Fluids   Use Maxalt as directed by provider.   Relaxation activities   See patient education-reviewed with patient   Monitor for signs of confusion, increase pain, neck stiffness,  Visual change, ataxia, fever, etc-ER   Return to  Urgent Care Center and Primary Care Provider as needed            Brigitte Pulse, ARNP    [facility]ENT [facility]  [place] [facility] AT [facility]  [address]  [facility] [place] [address]  [phone]

## 2022-08-06 ENCOUNTER — Encounter: Payer: Self-pay | Admitting: *Deleted

## 2022-12-04 ENCOUNTER — Encounter (INDEPENDENT_AMBULATORY_CARE_PROVIDER_SITE_OTHER): Payer: Self-pay | Admitting: Nurse Practitioner

## 2022-12-04 ENCOUNTER — Ambulatory Visit (INDEPENDENT_AMBULATORY_CARE_PROVIDER_SITE_OTHER): Payer: No Typology Code available for payment source | Admitting: Nurse Practitioner

## 2022-12-04 VITALS — BP 118/73 | HR 83 | Temp 98.2°F | Resp 14 | Wt 156.4 lb

## 2022-12-04 DIAGNOSIS — H60503 Unspecified acute noninfective otitis externa, bilateral: Secondary | ICD-10-CM

## 2022-12-04 DIAGNOSIS — H6123 Impacted cerumen, bilateral: Secondary | ICD-10-CM

## 2022-12-04 MED ORDER — NEOMYCIN-POLYMYXIN-HC 3.5-10000-1 OT SUSP
4.0000 [drp] | Freq: Four times a day (QID) | OTIC | 1 refills | Status: AC
Start: 2022-12-04 — End: 2022-12-14

## 2022-12-04 MED ORDER — FLUCONAZOLE 150 MG OR TABS
150.0000 mg | ORAL_TABLET | ORAL | 0 refills | Status: AC
Start: 2022-12-04 — End: 2022-12-08

## 2022-12-04 MED ORDER — CIPROFLOXACIN HCL 750 MG OR TABS
750.0000 mg | ORAL_TABLET | Freq: Two times a day (BID) | ORAL | 0 refills | Status: AC
Start: 2022-12-04 — End: 2022-12-11

## 2022-12-04 NOTE — Patient Instructions (Signed)
Your medical care was provided today by: Garcia Jean-Baptiste, [place], ARNP-BC  Thank You for the opportunity to serve you.    Special Instructions:  Unless otherwise instructed, please see your Primary Care Provider (PCP)  in 3-4 days if your symptoms persist, or sooner if you worsen, or change dramatically.      If you are unable to see or do not have a PCP,  go to the nearest Hospital Emergency Department (ER) or return to the Urgent Care Center.      Always call 9-1-1 immediately if you develop a life threatening emergency.    Unless told otherwise please take all medications as directed and complete prescription therapies as directed.      If prescribed do not to take prescribed medication(s) for pain while operating a vehicle or heavy machinery due to potential side effects such as drowsiness and/or dizziness.     Other special instructions are listed below unless otherwise specified:

## 2022-12-04 NOTE — Progress Notes (Signed)
Chief Complaint   Patient presents with    Ear Problem     Clogged ears (L ear is worse) x more then a month, discharge x couple of weeks.        Subjective:       Keilen Dyke is a 62 year old female who presents on 12/04/2022.    Clogged ears (L ear is worse) x more then a month, discharge x couple of weeks.  Hx of yeast infection after taking antibiotics.           Patient Active Problem List   Diagnosis    Migraine with aura and without status migrainosus, not intractable    Iron deficiency anemia    Mixed anxiety and depressive disorder             Jayonna was seen today for ear problem.    Acute otitis externa of both ears, unspecified type  -     neomycin-polymyxin-hydrocortisone 3.5-10000-1 otic suspension; Place 4 drops into each ear 4 times a day for 10 days.  Dispense: 10 mL; Refill: 1  -     ciprofloxacin 750 MG tablet; Take 1 tablet (750 mg) by mouth 2 times a day for 7 days.  Dispense: 14 tablet; Refill: 0    Bilateral impacted cerumen         Objective:    Vitals: BP 118/73   Pulse 83   Temp 36.8 C (Temporal)   Resp 14   Wt 70.9 kg (156 lb 6.4 oz)   SpO2 99%     Physical Exam  Vitals and nursing note reviewed.   Constitutional:       Appearance: Normal appearance.   HENT:      Head: Normocephalic.      Right Ear: Swelling (canal) and tenderness (canal) present. There is impacted cerumen.      Left Ear: Swelling (canal) and tenderness (canal) present. There is impacted cerumen.      Ears:      Comments: Right external ear erythematous and swollen  Swelling and erythema anterior to right ear  Eyes:      Pupils: Pupils are equal, round, and reactive to light.   Cardiovascular:      Rate and Rhythm: Normal rate.   Pulmonary:      Effort: Pulmonary effort is normal.   Musculoskeletal:      Cervical back: No tenderness.   Lymphadenopathy:      Cervical: No cervical adenopathy.   Skin:     Findings: Erythema (right external ear) present.   Neurological:      General: No focal deficit present.       Mental Status: She is alert.   Psychiatric:         Mood and Affect: Mood normal.       Assessment and Plan:          Shauntai was seen today for ear problem.    Acute otitis externa of both ears, unspecified type    -     neomycin-polymyxin-hydrocortisone 3.5-10000-1 otic suspension; Place 4 drops into each ear 4 times a day for 10 days.  Dispense: 10 mL; Refill: 1  -     ciprofloxacin 750 MG tablet; Take 1 tablet (750 mg) by mouth 2 times a day for 7 days.  Dispense: 14 tablet; Refill: 0  -     fluconazole 150 MG tablet; Take 1 tablet (150 mg) by mouth every 72 hours for 2 doses.  Dispense:  2 tablet; Refill: 0  Warm compress  OTC pain medication as needed for pain  No swimming or water in ear until symptoms resolve.   AVS-reviewed  Medication: take as directed. Discussed medication in detail including dosing, side effects, and interactions.  F/U with primary care provider or urgent care 3-4 days if symptoms do not improve or before if symptoms gets worse.   The patient /parent or guardian indicates understanding of these issues and agrees with the plan.  Watch for the following signs that require additional evaluation: progressive lethargy or unresponsiveness, stiff neck, localized pain (ear, sinus, chest, abdomen), shortness of breath, painful breathing, progressive vomiting with weakness, bloody stools, or new rash. Please call your doctor, or go to the emergency room.          Bilateral impacted cerumen      bilateral ear irrigated by MA and patient tolerated the procedure well but unable to removed cerumen from left canal. Curette use to remove cerumen from left canal  Avoid Q-tip use in the ears as this can worsen ear wax compaction leading to hearing issues.   In the future, if this happens again may try using debrox (OTC ear wax softening drops) for a few days to encourage drainage and if it doesn't drain on its own, you may come to clinic for an ear lavage  Monitor for worsen ear pain, ear drainage,  dizziness, etc.   F/U as needed with Primary Care Provider or Urgent Care   The patient indicates understanding of these issues and agrees with the plan.                      Brigitte Pulse, ARNP    [facility] URGENT CARE  [facility]INE URGENT CARE AT [facility]  [address] [place] [address]  [phone]

## 2022-12-06 ENCOUNTER — Encounter (INDEPENDENT_AMBULATORY_CARE_PROVIDER_SITE_OTHER): Payer: No Typology Code available for payment source

## 2022-12-16 ENCOUNTER — Encounter (INDEPENDENT_AMBULATORY_CARE_PROVIDER_SITE_OTHER): Payer: Self-pay | Admitting: Nurse Practitioner

## 2022-12-18 ENCOUNTER — Encounter (INDEPENDENT_AMBULATORY_CARE_PROVIDER_SITE_OTHER): Payer: Self-pay | Admitting: Family Medicine

## 2022-12-18 ENCOUNTER — Ambulatory Visit (INDEPENDENT_AMBULATORY_CARE_PROVIDER_SITE_OTHER): Payer: No Typology Code available for payment source | Admitting: Family Medicine

## 2022-12-18 VITALS — BP 108/72 | HR 74 | Temp 97.5°F | Resp 14 | Wt 157.4 lb

## 2022-12-18 DIAGNOSIS — H60503 Unspecified acute noninfective otitis externa, bilateral: Secondary | ICD-10-CM

## 2022-12-18 NOTE — Progress Notes (Signed)
Subjective:     [name] is a 62 year old year old female who presents to [facility] at [facility] for   Chief Complaint   Patient presents with    Ear Problem     Both Ear drainage/infection x 2 days.      Pt recently treated for OE after cerumen impaction. 2d ago again noted ear dc for several hours. Here to check ears.         ROS       Objective:    Vitals: BP 108/72   Pulse 74   Temp 36.4 C (Temporal)   Resp 14   Wt 71.4 kg (157 lb 6.4 oz)   SpO2 100%   Physical Exam  HENT:      Right Ear: Swelling (resolving) present. No drainage. There is no impacted cerumen. Tympanic membrane is not erythematous.      Left Ear: Swelling present. No drainage. There is no impacted cerumen. Tympanic membrane is not erythematous.              Assessment and Plan:      Kelly Gamble was seen today for ear problem.    Acute otitis externa of both ears, unspecified type  Continue cortisporin. If not resolved in 3 days rtc for recheck and would consider diff topical or oral abx.

## 2023-02-05 ENCOUNTER — Telehealth: Payer: No Typology Code available for payment source | Admitting: Family

## 2023-02-05 DIAGNOSIS — H9212 Otorrhea, left ear: Secondary | ICD-10-CM

## 2023-02-05 MED ORDER — CIPROFLOXACIN-DEXAMETHASONE 0.3-0.1 % OT SUSP
OTIC | 0 refills | Status: AC
Start: 2023-02-05 — End: ?

## 2023-02-05 MED ORDER — CIPROFLOXACIN-DEXAMETHASONE 0.3-0.1 % OT SUSP
OTIC | 0 refills | Status: DC
Start: 2023-02-05 — End: 2023-02-05

## 2023-02-05 NOTE — Progress Notes (Signed)
[facility] Telemedicine Encounter  I conducted this encounter via secure, live, face-to-face video conference with the patient. I reviewed the risks and benefits of telemedicine as pertinent to this visit and the patient agreed to proceed.    Provider Location: Off-site location (home, non-[place] location)  Patient Location: At home    Present with patient: No one else present     Chief Complaint   Patient presents with    Ear Drainage        Subjective:     Kelly Gamble is a 62 year old female who presents with left ear drainage    Seen in [facility] ago, dx R otitis externa. Quite severe. Given drops + oral Cipro  Did improve    Two nights ago had drainage from L ear. Quite a bit. Used tissue and was moist  Not bloody    No resp congestion  No fever  No ear pain    Hx cerumen impaction, is someone who regularly needs to clean ear wax    Both R and L ear now have a little crusting near the external canal  She is concerned the infection will increase again       Objective:    Vitals: There were no vitals taken for this visit.  Physical Exam  Constitutional:       General: Does not appear to be in distress, converses easily, appears comfortable   Pulmonary:      Effort: Pulmonary effort is normal, able to speak in complete sentences without difficulty   Neurological:      Mental Status: Alert and oriented to person, place, and time.   Psychiatric:         Mood and Affect: Mood normal.         Behavior: Behavior normal.         Thought Content: Thought content normal.         Judgment: Judgment normal.            Assessment and Plan:    Kelly Gamble was seen today for ear drainage.    Ear discharge of left ear  My recommendation is she is seen in person so an oto exam can be done and she can get a more definitive diagnosis. She is quite concerned about the cost of the visit, two visits in August were quite expensive for her as she had not met her $1000 insurance deductible and still hasn't. We discussed that this telemedicine  visit was the same cost as an in-person visit. She is quite hesitant to be seen in person so I did send a rx for Ciprodex drops for her to start. If her symptoms are not improving over the next 48-72hrs I do recommend she be seen in person for more definitive diagnosis. Earlier if any severely worsening symptoms. All questions answered.     -     ciprofloxacin-dexAMETHasone 0.3-0.1 % otic suspension; 4 drops into affected ear(s) BID x 7 days  Dispense: 7.5 mL; Refill: 0      The patient's identification was verified by using two patient identifiers and the following: government photo ID on file in chart.

## 2023-06-09 ENCOUNTER — Encounter: Payer: Self-pay | Admitting: Family

## 2024-02-20 ENCOUNTER — Encounter: Payer: Self-pay | Admitting: Family
